# Patient Record
Sex: Male | Born: 1985 | Race: White | Hispanic: No | Marital: Single | Smoking: Never smoker
Health system: Southern US, Community
[De-identification: ages and names within clinical notes are randomized; demographics above are authoritative.]

---
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
## 2011-10-12 ENCOUNTER — Other Ambulatory Visit: Payer: Self-pay | Admitting: Occupational Medicine

## 2011-10-12 ENCOUNTER — Ambulatory Visit (HOSPITAL_BASED_OUTPATIENT_CLINIC_OR_DEPARTMENT_OTHER)
Admission: RE | Admit: 2011-10-12 | Discharge: 2011-10-12 | Disposition: A | Discharge status: Home / Self Care | Payer: Self-pay | Source: Ambulatory Visit | Attending: Occupational Medicine | Admitting: Occupational Medicine

## 2011-10-12 DIAGNOSIS — Z0000 Encounter for general adult medical examination without abnormal findings: Secondary | ICD-10-CM

## 2013-05-07 IMAGING — CR DG CHEST 1V
1 series · 1 of 1 positions shown · non-contrast
Comparison: 01/31/2005

CLINICAL DATA: Annual physical.

CHEST - 1 VIEW

[w chest pa]
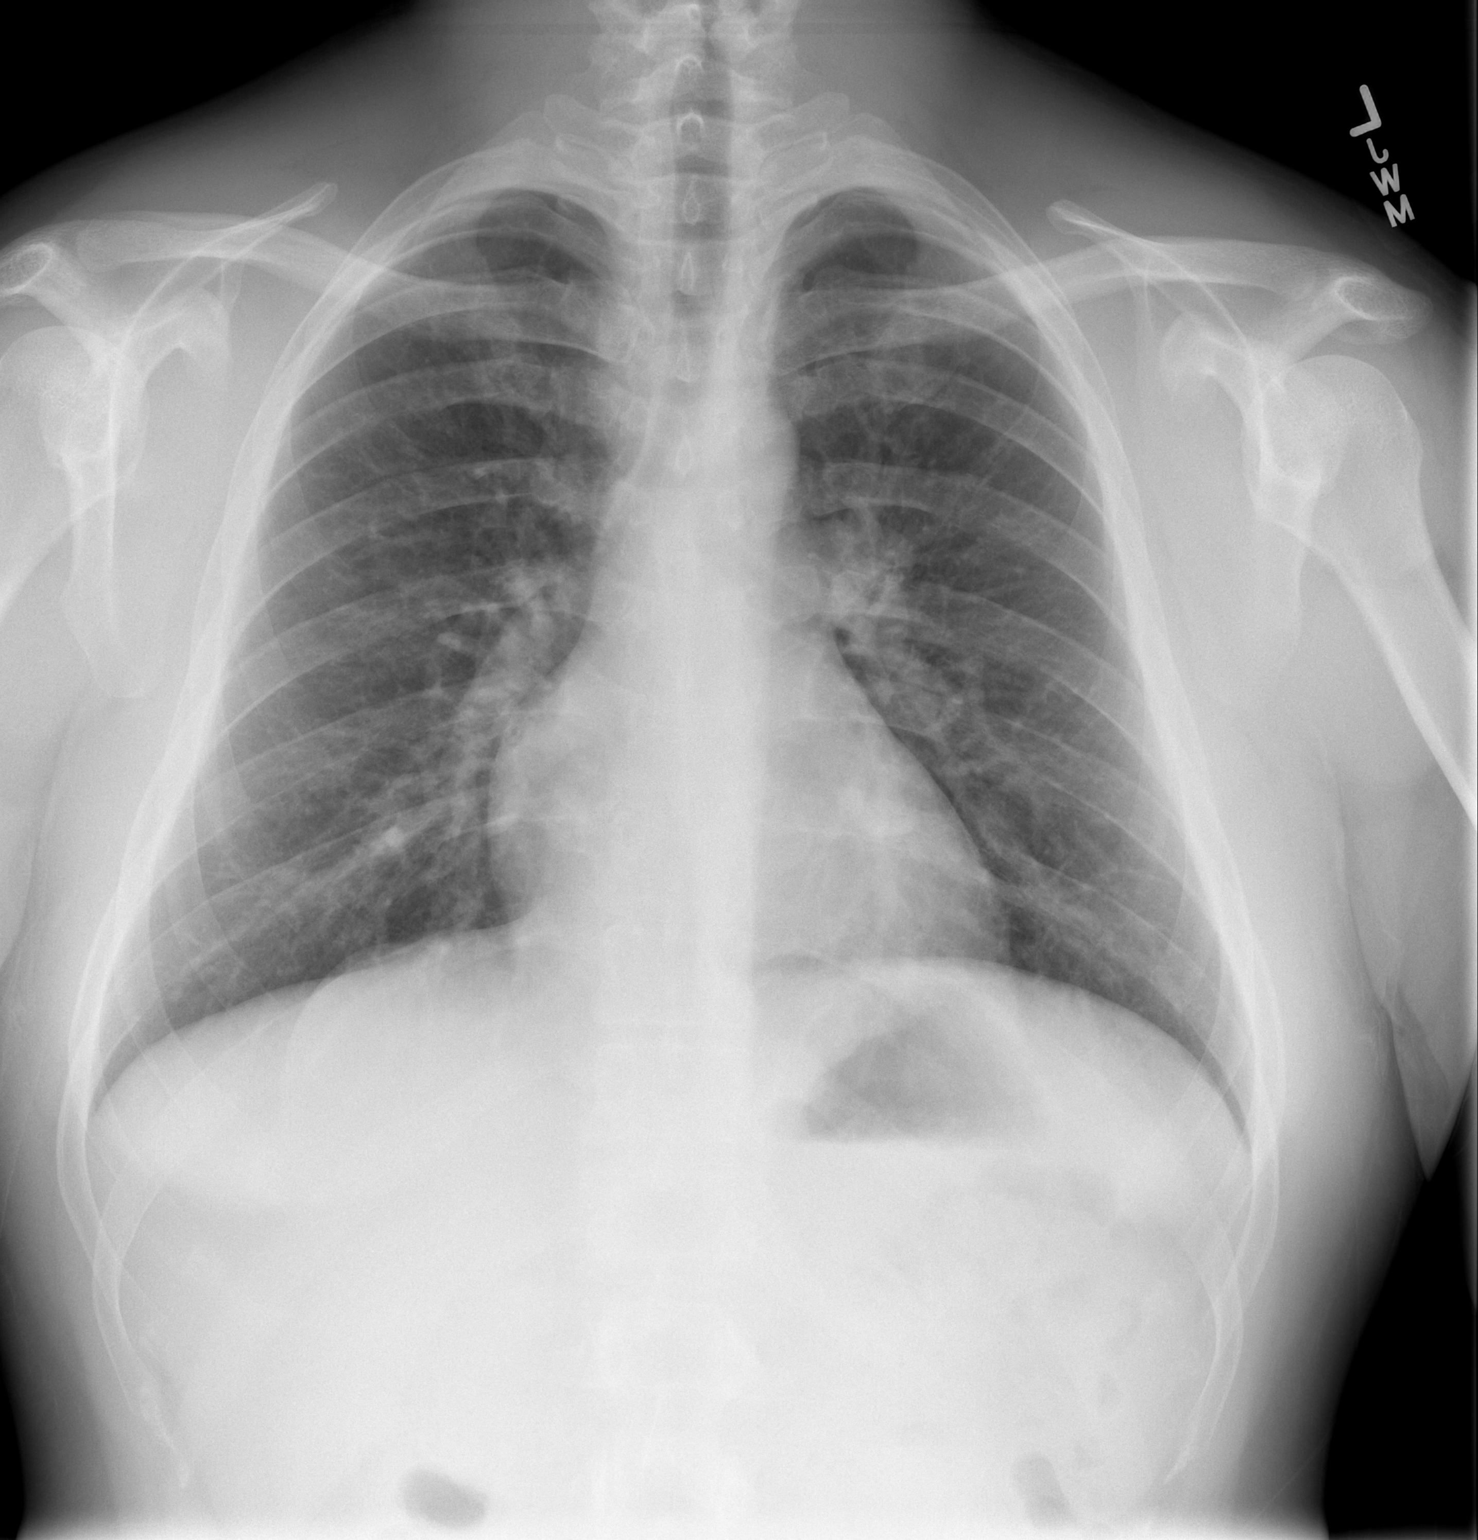

[1 of 1 positions shown; findings below may reference images not displayed]

FINDINGS: The heart size and mediastinal contours are within normal
limits.  Both lungs are clear.
IMPRESSION: No active disease.

## 2020-09-25 ENCOUNTER — Emergency Department
Admission: EM | Admit: 2020-09-25 | Discharge: 2020-09-25 | Disposition: A | Discharge status: Home / Self Care | Payer: Worker's Compensation | Attending: Student in an Organized Health Care Education/Training Program | Admitting: Student in an Organized Health Care Education/Training Program

## 2020-09-25 ENCOUNTER — Other Ambulatory Visit: Payer: Self-pay

## 2020-09-25 DIAGNOSIS — Z7721 Contact with and (suspected) exposure to potentially hazardous body fluids: Secondary | ICD-10-CM | POA: Insufficient documentation

## 2020-09-25 NOTE — Discharge Instructions (Addendum)
Your labs were sent today to [facility].  Please follow-up with your department as indicated.

## 2020-09-25 NOTE — ED Provider Notes (Signed)
[facility] Emergency Department Provider Note  ____________________________________________   Event Date/Time   First MD Initiated Contact with Patient 09/25/20 1727     (approximate)  I have reviewed the triage vital signs and the nursing notes.   HISTORY  Chief Complaint Body Fluid Exposure  HPI Justin Velasquez is a 35 y.o. male who presents to the emergency department after possible blood exposure while at work.  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  He has unsure if any got in his eye explicitly, though it was escorted across his face.  He quickly wiped the skin portion with an alcohol swab and then irrigated his eye.  He was sent here per company policy for baseline lab draw.  He denies any symptoms at this time.         History reviewed. No pertinent past medical history.  There are no problems to display for this patient.   History reviewed. No pertinent surgical history.  Prior to Admission medications   Not on File    Allergies Patient has no known allergies.  History reviewed. No pertinent family history.  Social History Social History   Tobacco Use   Smoking status: Never   Smokeless tobacco: Never  Substance Use Topics   Alcohol use: Not Currently   Drug use: Never    Review of Systems  Constitutional: No fever/chills Eyes: No visual changes. ENT: No sore throat. Cardiovascular: Denies chest pain. Respiratory: Denies shortness of breath. Gastrointestinal: No abdominal pain.  No nausea, no vomiting.  No diarrhea.  No constipation. Genitourinary: Negative for dysuria. Musculoskeletal: Negative for back pain. Skin: Negative for rash. Neurological: Negative for headaches, focal weakness or numbness.   ____________________________________________   PHYSICAL EXAM:  VITAL SIGNS: ED Triage  Vitals  Enc Vitals Group     BP 09/25/20 1617 (!) 141/96     Pulse Rate 09/25/20 1617 92     Resp 09/25/20 1617 18     Temp 09/25/20 1617 98.9 F (37.2 C)     Temp Source 09/25/20 1617 Oral     SpO2 09/25/20 1617 97 %     Weight 09/25/20 1615 210 lb (95.3 kg)     Height 09/25/20 1615 5\' 8"  (1.727 m)     Head Circumference --      Peak Flow --      Pain Score 09/25/20 1615 0     Pain Loc --      Pain Edu? --      Excl. in GC? --    Constitutional: Alert and oriented. Well appearing and in no acute distress. Eyes: Conjunctivae are normal. PERRL. EOMI. Head: Atraumatic. Nose: No congestion/rhinnorhea. Mouth/Throat: Mucous membranes are moist.   Neck: No stridor.   Neurologic:  Normal speech and language.  Skin:  Skin is warm, dry and intact. No rash noted. Psychiatric: Mood and affect are normal. Speech and behavior are normal.   ____________________________________________   INITIAL IMPRESSION / ASSESSMENT AND PLAN / ED COURSE  As part of my medical decision making, I reviewed the following data within the electronic MEDICAL RECORD NUMBER Nursing notes reviewed and incorporated and Notes from prior ED visits        Patient is a 35 year old male who presents to the emergency department after possible blood exposure into his left eye, see above.  Baseline labs were drawn per  protocol and sent to [facility].  Patient was given information on how to obtain results.  The patient that the blood was from is also being evaluated in the emergency department, recommended lab draw from them.  Patient already irrigated eye extensively, states he is not even sure that any got in the eye itself.  Return precautions were discussed at length.  The patient has no known risk factors for HIV, and thus will defer prep until information from labs returns.  Patient stable this time for outpatient follow-up.      ____________________________________________   FINAL CLINICAL IMPRESSION(S) / ED  DIAGNOSES  Final diagnoses:  Exposure to blood     ED Discharge Orders     None        Note:  This document was prepared using Dragon voice recognition software and may include unintentional dictation errors.    Lucy Chris, PA 09/25/20 2049    Willy Eddy, MD 09/29/20 [phone]

## 2020-09-25 NOTE — ED Triage Notes (Signed)
Pt via POV from home. Pt was in a call and was trying to get a CBG on a pt, and blood shot on his L eye. Pt is employed throught [employer]. Pt did wash his eyes out after the fact. Pt is A&Ox4 and NAD.
